# Patient Record
Sex: Male | Born: 1960 | Race: White | Hispanic: No | Marital: Single | Smoking: Never smoker
Health system: Southern US, Community
[De-identification: ages and names within clinical notes are randomized; demographics above are authoritative.]

---

## 2021-07-03 ENCOUNTER — Other Ambulatory Visit: Payer: Self-pay

## 2021-07-03 ENCOUNTER — Emergency Department (HOSPITAL_BASED_OUTPATIENT_CLINIC_OR_DEPARTMENT_OTHER)
Admission: EM | Admit: 2021-07-03 | Discharge: 2021-07-04 | Disposition: A | Discharge status: Home / Self Care | Payer: BC Managed Care – PPO | Attending: Emergency Medicine | Admitting: Emergency Medicine

## 2021-07-03 ENCOUNTER — Emergency Department (HOSPITAL_BASED_OUTPATIENT_CLINIC_OR_DEPARTMENT_OTHER): Payer: BC Managed Care – PPO

## 2021-07-03 ENCOUNTER — Encounter (HOSPITAL_BASED_OUTPATIENT_CLINIC_OR_DEPARTMENT_OTHER): Payer: Self-pay | Admitting: Emergency Medicine

## 2021-07-03 DIAGNOSIS — X58XXXA Exposure to other specified factors, initial encounter: Secondary | ICD-10-CM | POA: Diagnosis not present

## 2021-07-03 DIAGNOSIS — R220 Localized swelling, mass and lump, head: Secondary | ICD-10-CM | POA: Insufficient documentation

## 2021-07-03 DIAGNOSIS — R7309 Other abnormal glucose: Secondary | ICD-10-CM | POA: Diagnosis not present

## 2021-07-03 DIAGNOSIS — K0889 Other specified disorders of teeth and supporting structures: Secondary | ICD-10-CM | POA: Diagnosis present

## 2021-07-03 DIAGNOSIS — T887XXA Unspecified adverse effect of drug or medicament, initial encounter: Secondary | ICD-10-CM

## 2021-07-03 LAB — COMPREHENSIVE METABOLIC PANEL
ALT: 20 U/L (ref 0–44)
AST: 19 U/L (ref 15–41)
Albumin: 4.1 g/dL (ref 3.5–5.0)
Alkaline Phosphatase: 85 U/L (ref 38–126)
Anion gap: 7 (ref 5–15)
BUN: 18 mg/dL (ref 6–20)
CO2: 23 mmol/L (ref 22–32)
Calcium: 8.8 mg/dL — ABNORMAL LOW (ref 8.9–10.3)
Chloride: 109 mmol/L (ref 98–111)
Creatinine, Ser: 1.14 mg/dL (ref 0.61–1.24)
GFR, Estimated: >60 mL/min (ref >60)
Glucose, Bld: 214 mg/dL — ABNORMAL HIGH (ref 70–99)
Potassium: 4 mmol/L (ref 3.5–5.1)
Sodium: 139 mmol/L (ref 135–145)
Total Bilirubin: 0.5 mg/dL (ref 0.3–1.2)
Total Protein: 7.3 g/dL (ref 6.5–8.1)

## 2021-07-03 LAB — CBC WITH DIFFERENTIAL/PLATELET
Abs Immature Granulocytes: 0.05 10*3/uL (ref 0.00–0.07)
Basophils Absolute: 0.1 10*3/uL (ref 0.0–0.1)
Basophils Relative: 1 %
Eosinophils Absolute: 0.3 10*3/uL (ref 0.0–0.5)
Eosinophils Relative: 4 %
HCT: 44.3 % (ref 39.0–52.0)
Hemoglobin: 15.5 g/dL (ref 13.0–17.0)
Immature Granulocytes: 1 %
Lymphocytes Relative: 23 %
Lymphs Abs: 2 10*3/uL (ref 0.7–4.0)
MCH: 32.4 pg (ref 26.0–34.0)
MCHC: 35 g/dL (ref 30.0–36.0)
MCV: 92.5 fL (ref 80.0–100.0)
Monocytes Absolute: 1 10*3/uL (ref 0.1–1.0)
Monocytes Relative: 12 %
Neutro Abs: 5.2 10*3/uL (ref 1.7–7.7)
Neutrophils Relative %: 59 %
Platelets: 248 10*3/uL (ref 150–400)
RBC: 4.79 MIL/uL (ref 4.22–5.81)
RDW: 11.9 % (ref 11.5–15.5)
WBC: 8.7 10*3/uL (ref 4.0–10.5)
nRBC: 0 % (ref 0.0–0.2)

## 2021-07-03 LAB — CBG MONITORING, ED: Glucose-Capillary: 217 mg/dL — ABNORMAL HIGH (ref 70–99)

## 2021-07-03 NOTE — ED Notes (Signed)
Patient transported to CT 

## 2021-07-03 NOTE — ED Triage Notes (Signed)
Pt reports he started shaking after taking medications for toothache; he had taken them earlier in the day w/o issues; sts med for pain and antibiotic; A& O x 4; NAD

## 2021-07-03 NOTE — ED Notes (Signed)
ED Provider at bedside. 

## 2021-07-04 LAB — URINALYSIS, ROUTINE W REFLEX MICROSCOPIC
Glucose, UA: 250 mg/dL — AB
Hgb urine dipstick: NEGATIVE
Ketones, ur: NEGATIVE mg/dL
Leukocytes,Ua: NEGATIVE
Nitrite: NEGATIVE
Protein, ur: NEGATIVE mg/dL
Specific Gravity, Urine: 1.025 (ref 1.005–1.030)
pH: 6 (ref 5.0–8.0)

## 2021-07-04 NOTE — Discharge Instructions (Addendum)
You were evaluated in the Emergency Department and after careful evaluation, we did not find any emergent condition requiring admission or further testing in the hospital.  Your exam/testing today was overall reassuring.  Symptoms likely due to side effect of the hydrocodone medication.  Recommend continuing the antibiotic and following up with your dentist.  Please return to the Emergency Department if you experience any worsening of your condition.  Thank you for allowing [place] to be a part of your care.

## 2021-07-04 NOTE — ED Provider Notes (Signed)
[facility] Emergency Department Provider Note MRN:  [id number]  Arrival date & time: 07/04/21     Chief Complaint   Medication Reaction   History of Present Illness   Ronnie Blackwell is a 60 y.o. year-old male with a history of diabetes presenting to the ED with chief complaint of medication reaction.  Patient was recently started on Augmentin and hydrocodone in the setting of a tooth infection.  He has been having some left mandibular molar pain for the past several days with some mild facial swelling.  This evening, about an hour after taking the hydrocodone and all of his evening medications he began having some fogginess, confusion, was not acting right per significant other.  Had some tremulous behavior as well, which has resolved.  Was alert and able to communicate during this episode.  Denies any seizure history, no recent fever, no cough, no burning with urination.  Feels better now.  Review of Systems  A complete 10 system review of systems was obtained and all systems are negative except as noted in the HPI and PMH.   Patient's Health History   History reviewed. No pertinent past medical history.  History reviewed. No pertinent surgical history.  No family history on file.  Social History   Socioeconomic History   Marital status: Single    Spouse name: Not on file   Number of children: Not on file   Years of education: Not on file   Highest education level: Not on file  Occupational History   Not on file  Tobacco Use   Smoking status: Never   Smokeless tobacco: Never  Substance and Sexual Activity   Alcohol use: Not Currently   Drug use: Not Currently   Sexual activity: Not on file  Other Topics Concern   Not on file  Social History Narrative   Not on file   Social Determinants of Health   Financial Resource Strain: Not on file  Food Insecurity: Not on file  Transportation Needs: Not on file  Physical Activity: Not on file   Stress: Not on file  Social Connections: Not on file  Intimate Partner Violence: Not on file     Physical Exam   Vitals:   07/03/21 2141 07/04/21 0013  BP: (!) 177/95 (!) 155/82  Pulse: 84 70  Resp: 20 16  Temp: 99.5 F (37.5 C)   SpO2: 97% 99%    CONSTITUTIONAL: Well-appearing, NAD NEURO:  Alert and oriented x 3, no focal deficits EYES:  eyes equal and reactive ENT/NECK:  no LAD, no JVD; swelling and tenderness to the left mandibular molar, no gingival abscess CARDIO: Regular rate, well-perfused, normal S1 and S2 PULM:  CTAB no wheezing or rhonchi GI/GU:  normal bowel sounds, non-distended, non-tender MSK/SPINE:  No gross deformities, no edema SKIN:  no rash, atraumatic PSYCH:  Appropriate speech and behavior  *Additional and/or pertinent findings included in MDM below  Diagnostic and Interventional Summary    EKG Interpretation  Date/Time:    Ventricular Rate:    PR Interval:    QRS Duration:   QT Interval:    QTC Calculation:   R Axis:     Text Interpretation:         Labs Reviewed  COMPREHENSIVE METABOLIC PANEL - Abnormal; Notable for the following components:      Result Value   Glucose, Bld 214 (*)    Calcium 8.8 (*)    All other components within normal limits  URINALYSIS, ROUTINE  W REFLEX MICROSCOPIC - Abnormal; Notable for the following components:   Glucose, UA 250 (*)    Bilirubin Urine SMALL (*)    All other components within normal limits  CBG MONITORING, ED - Abnormal; Notable for the following components:   Glucose-Capillary 217 (*)    All other components within normal limits  CBC WITH DIFFERENTIAL/PLATELET    CT HEAD WO CONTRAST ( )  Final Result      Medications - No data to display   Procedures  /  Critical Care Procedures  ED Course and Medical Decision Making  I have reviewed the triage vital signs, the nursing notes, and pertinent available records from the EMR.  Listed above are laboratory and imaging tests that I  personally ordered, reviewed, and interpreted and then considered in my medical decision making (see below for details).  Suspect side effect of hydrocodone, had also considered a seizure but seemed atypical given he was conscious, no classic seizure activity.  Screening CT head is normal.  Rigors in the setting of infection also considered but patient has reassuring vital signs, normal urine sample, normal labs, no cough recently, very well-appearing with a normal neurological exam.  He does have some swelling to the left side of the face but no erythema, nothing to suggest facial cellulitis.  Appropriate for discharge with return precautions.       Elmer Sow. Pilar Plate, MD [facility] Emergency Medicine [facility] [email or web address]  Final Clinical Impressions(s) / ED Diagnoses     ICD-10-CM   1. Medication side effect  [email or web address]       ED Discharge Orders     None        Discharge Instructions Discussed with and Provided to Patient:    Discharge Instructions      You were evaluated in the Emergency Department and after careful evaluation, we did not find any emergent condition requiring admission or further testing in the hospital.  Your exam/testing today was overall reassuring.  Symptoms likely due to side effect of the hydrocodone medication.  Recommend continuing the antibiotic and following up with your dentist.  Please return to the Emergency Department if you experience any worsening of your condition.  Thank you for allowing [place] to be a part of your care.        Sabas Sous, MD 07/04/21 [phone]

## 2022-10-04 IMAGING — CT CT HEAD W/O CM
3 series · 16 of 47 positions shown, 19 images · non-contrast
Comparison: None.

CLINICAL DATA: Seizure, nontraumatic (Age >= 41y)

EXAM:
CT HEAD WITHOUT CONTRAST
TECHNIQUE: Contiguous axial images were obtained from the base of the skull
through the vertex without intravenous contrast.

[Series 2: head wo · axial · 0.45mm/px · z∈[-244,-94]mm · 10 of 36 slices shown, 13 images]
[im 3/36  brain]
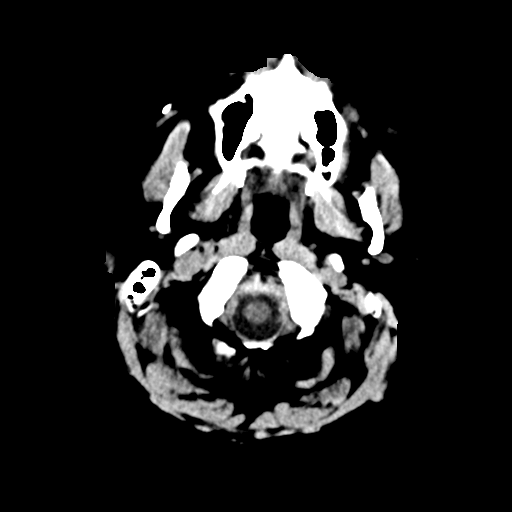
[im 3/36  bone]
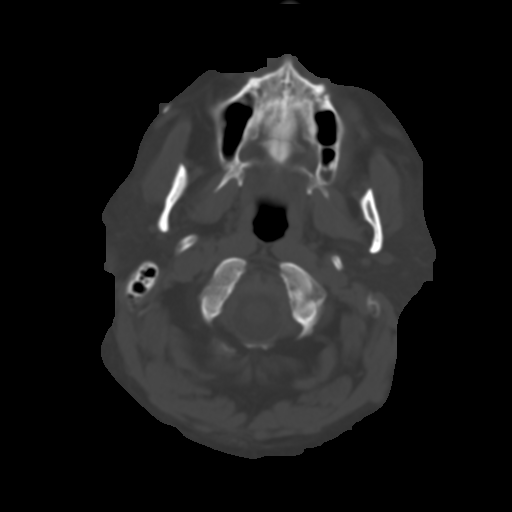
[im 7/36  brain]
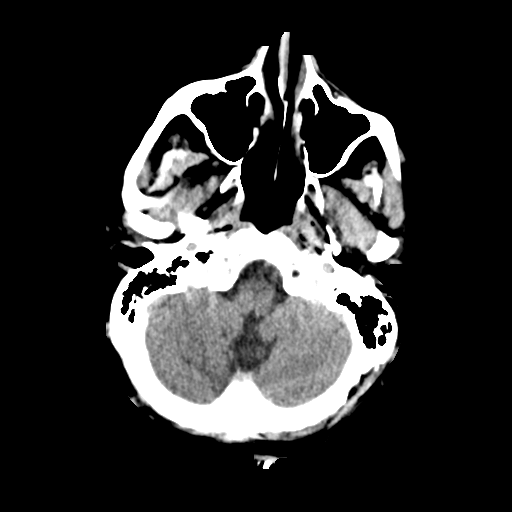
[im 10/36  brain]
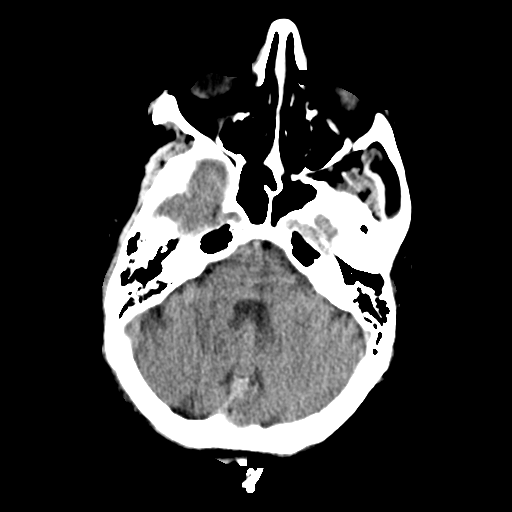
[im 13/36  brain]
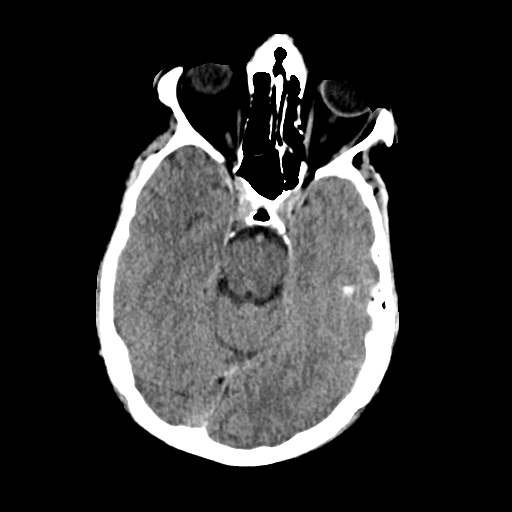
[im 16/36  brain]
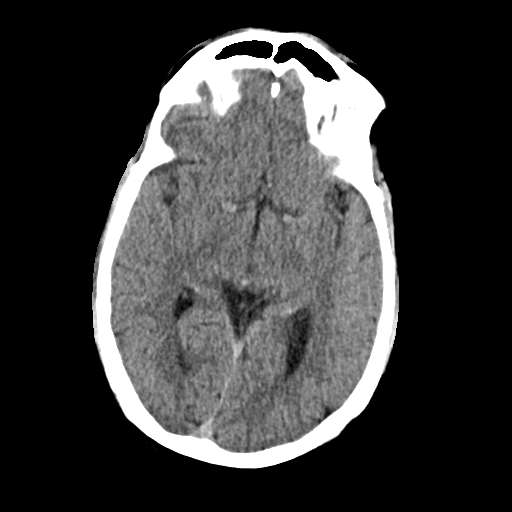
[im 16/36  bone]
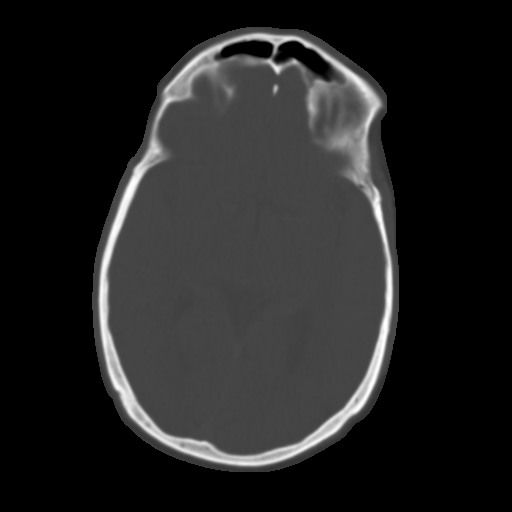
[im 20/36  brain]
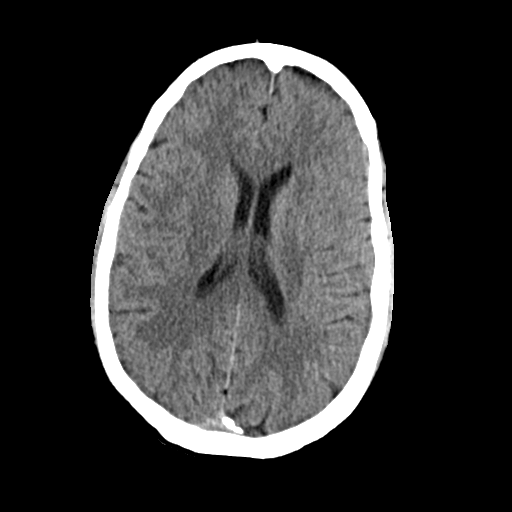
[im 23/36  brain]
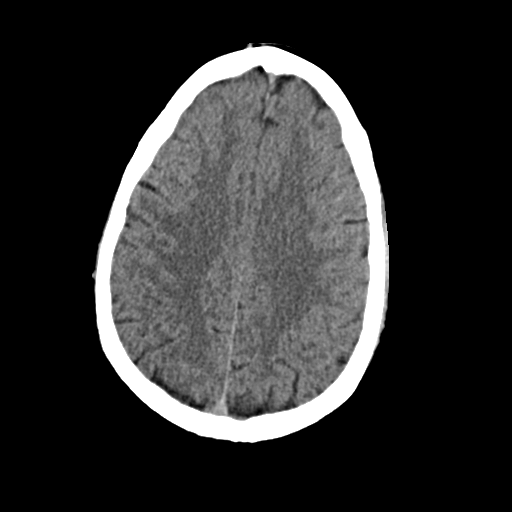
[im 27/36  brain]
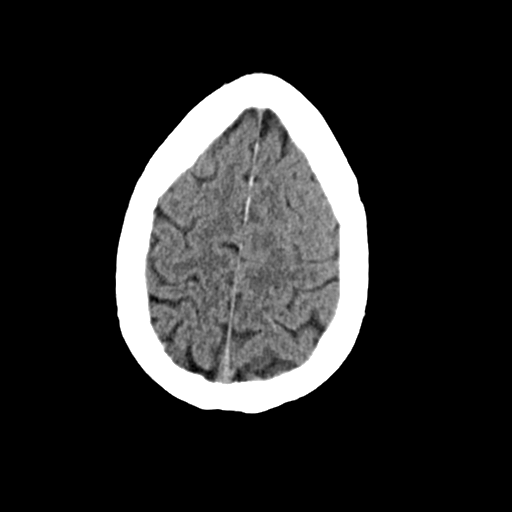
[im 29/36  brain]
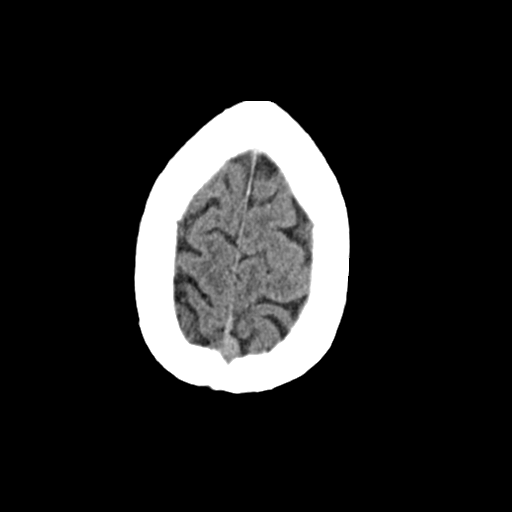
[im 29/36  bone]
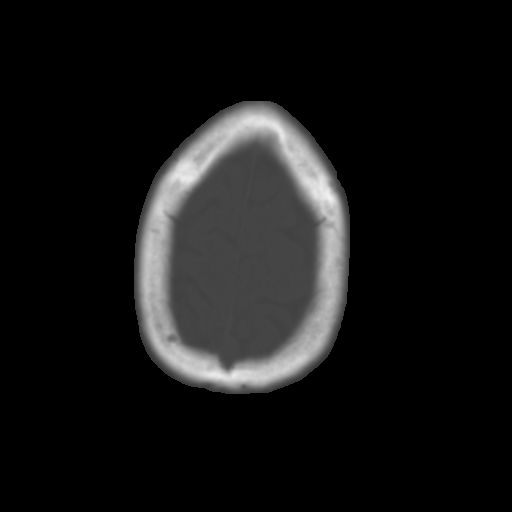
[im 33/36  brain]
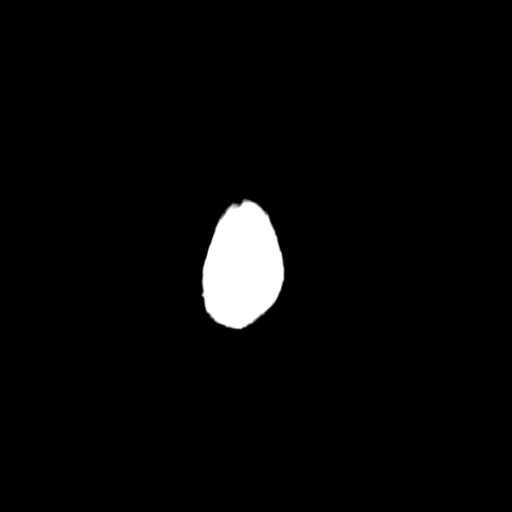

[Series 4: coronal soft · coronal · 0.35mm/px · 3 of 69 slices shown]
[im 31/69  brain]
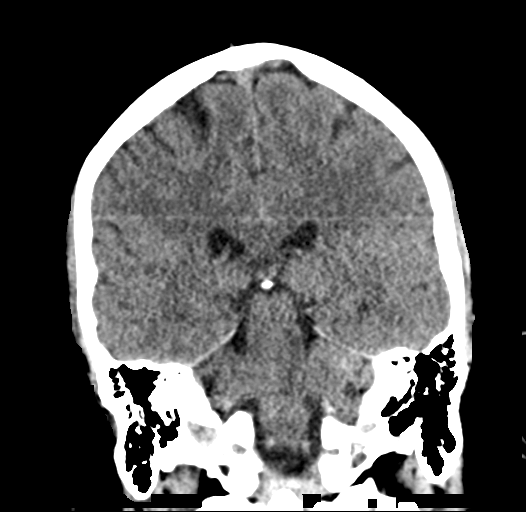
[im 38/69  brain]
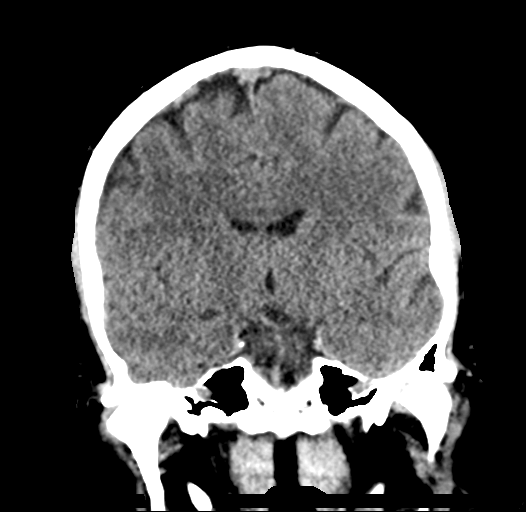
[im 46/69  brain]
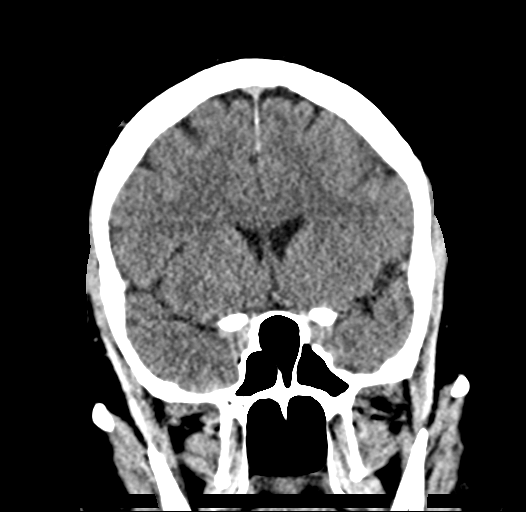

[Series 5: sag soft · sagittal · 0.35mm/px · 3 of 67 slices shown]
[im 23/67  brain]
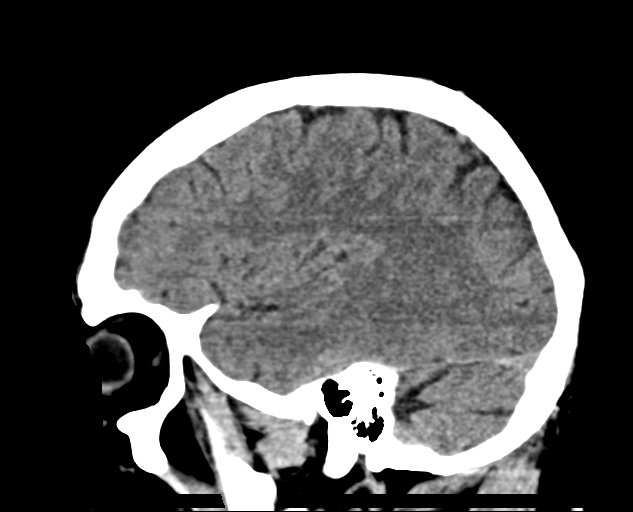
[im 34/67  brain]
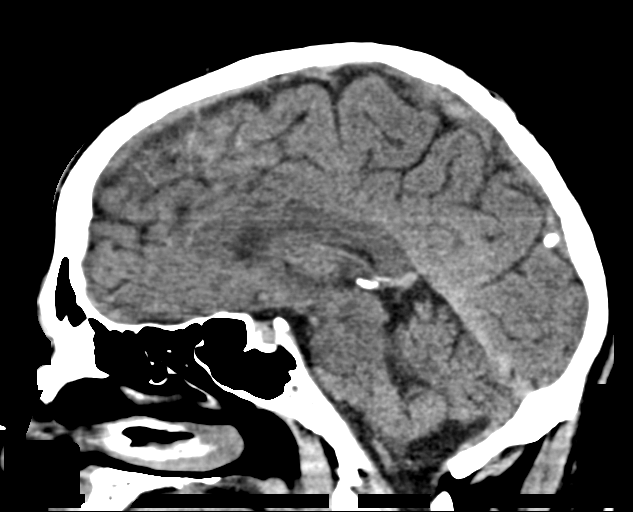
[im 45/67  brain]
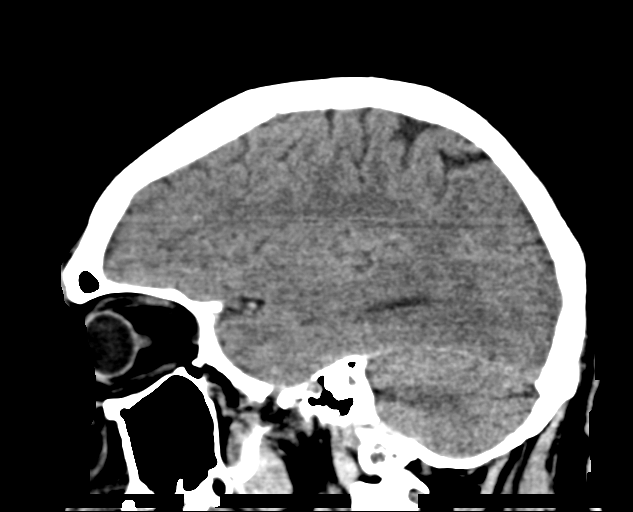

[16 of 47 positions shown; findings below may reference images not displayed]

FINDINGS: Brain: Normal anatomic configuration. No abnormal intra or
extra-axial mass lesion or fluid collection. No abnormal mass effect
or midline shift. No evidence of acute intracranial hemorrhage or
infarct. Ventricular size is normal. Cerebellum unremarkable.

Vascular: Unremarkable

Skull: Intact

Sinuses/Orbits: Paranasal sinuses are clear. Orbits are
unremarkable.

Other: Mastoid air cells and middle ear cavities are clear.
IMPRESSION: No acute intracranial abnormality.  Normal examination.
